# Patient Record
Sex: Female | Born: 1990 | Race: Black or African American | Hispanic: No | Marital: Single | Smoking: Never smoker
Health system: Southern US, Community
[De-identification: ages and names within clinical notes are randomized; demographics above are authoritative.]

---

## 2012-05-14 ENCOUNTER — Encounter (HOSPITAL_COMMUNITY): Payer: Self-pay | Admitting: *Deleted

## 2012-05-14 ENCOUNTER — Emergency Department (HOSPITAL_COMMUNITY): Payer: No Typology Code available for payment source

## 2012-05-14 ENCOUNTER — Emergency Department (HOSPITAL_COMMUNITY)
Admission: EM | Admit: 2012-05-14 | Discharge: 2012-05-14 | Disposition: A | Discharge status: Home / Self Care | Payer: No Typology Code available for payment source | Attending: Emergency Medicine | Admitting: Emergency Medicine

## 2012-05-14 DIAGNOSIS — Y92410 Unspecified street and highway as the place of occurrence of the external cause: Secondary | ICD-10-CM | POA: Insufficient documentation

## 2012-05-14 DIAGNOSIS — Y9389 Activity, other specified: Secondary | ICD-10-CM | POA: Insufficient documentation

## 2012-05-14 DIAGNOSIS — S161XXA Strain of muscle, fascia and tendon at neck level, initial encounter: Secondary | ICD-10-CM

## 2012-05-14 DIAGNOSIS — S139XXA Sprain of joints and ligaments of unspecified parts of neck, initial encounter: Secondary | ICD-10-CM | POA: Insufficient documentation

## 2012-05-14 MED ORDER — CYCLOBENZAPRINE HCL 10 MG PO TABS: 10.0000 mg | ORAL_TABLET | Freq: Two times a day (BID) | ORAL | Status: AC | PRN

## 2012-05-14 MED ORDER — IBUPROFEN 800 MG PO TABS: 800.0000 mg | ORAL_TABLET | Freq: Three times a day (TID) | ORAL | Status: AC
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
## 2012-05-14 NOTE — ED Notes (Signed)
Upstill, PA at bedside for evaluation.

## 2012-05-14 NOTE — ED Notes (Signed)
Returned from xray

## 2012-05-14 NOTE — ED Notes (Signed)
Pt c/o soreness in neck. No distress.
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
## 2012-05-15 NOTE — ED Provider Notes (Signed)
Medical screening examination/treatment/procedure(s) were performed by non-physician practitioner and as supervising physician I was immediately available for consultation/collaboration.   David Yelverton, MD 05/15/12 0756 
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
## 2016-09-30 ENCOUNTER — Ambulatory Visit (HOSPITAL_COMMUNITY)
Admission: EM | Admit: 2016-09-30 | Discharge: 2016-09-30 | Disposition: A | Discharge status: Home / Self Care | Payer: Self-pay | Attending: Family Medicine | Admitting: Family Medicine

## 2016-09-30 ENCOUNTER — Encounter (HOSPITAL_COMMUNITY): Payer: Self-pay

## 2016-09-30 DIAGNOSIS — S161XXA Strain of muscle, fascia and tendon at neck level, initial encounter: Secondary | ICD-10-CM

## 2016-09-30 DIAGNOSIS — M549 Dorsalgia, unspecified: Secondary | ICD-10-CM

## 2016-09-30 DIAGNOSIS — M542 Cervicalgia: Secondary | ICD-10-CM

## 2016-09-30 MED ORDER — CYCLOBENZAPRINE HCL 10 MG PO TABS: 10.0000 mg | ORAL_TABLET | Freq: Two times a day (BID) | ORAL | 0 refills | Status: AC | PRN

## 2016-09-30 MED ORDER — NAPROXEN 500 MG PO TABS: 500.0000 mg | ORAL_TABLET | Freq: Two times a day (BID) | ORAL | 0 refills | Status: AC
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
## 2018-11-09 ENCOUNTER — Other Ambulatory Visit: Payer: Self-pay

## 2018-11-09 DIAGNOSIS — Z20822 Contact with and (suspected) exposure to covid-19: Secondary | ICD-10-CM

## 2018-11-11 LAB — NOVEL CORONAVIRUS, NAA: SARS-CoV-2, NAA: NOT DETECTED

## 2019-02-07 ENCOUNTER — Other Ambulatory Visit: Payer: Self-pay

## 2019-02-07 DIAGNOSIS — Z20822 Contact with and (suspected) exposure to covid-19: Secondary | ICD-10-CM

## 2019-02-10 LAB — NOVEL CORONAVIRUS, NAA: SARS-CoV-2, NAA: NOT DETECTED

## 2019-03-12 ENCOUNTER — Ambulatory Visit: Payer: BC Managed Care – PPO | Attending: Internal Medicine

## 2019-03-12 DIAGNOSIS — Z20822 Contact with and (suspected) exposure to covid-19: Secondary | ICD-10-CM | POA: Insufficient documentation

## 2019-03-15 LAB — NOVEL CORONAVIRUS, NAA: SARS-CoV-2, NAA: NOT DETECTED

## 2019-09-02 ENCOUNTER — Ambulatory Visit: Payer: BC Managed Care – PPO | Attending: Internal Medicine

## 2019-09-02 DIAGNOSIS — Z20822 Contact with and (suspected) exposure to covid-19: Secondary | ICD-10-CM

## 2019-09-03 LAB — NOVEL CORONAVIRUS, NAA: SARS-CoV-2, NAA: NOT DETECTED

## 2019-09-03 LAB — SARS-COV-2, NAA 2 DAY TAT

## 2019-10-30 ENCOUNTER — Other Ambulatory Visit: Payer: Self-pay | Admitting: Critical Care Medicine

## 2019-10-30 ENCOUNTER — Other Ambulatory Visit: Payer: BC Managed Care – PPO

## 2019-10-30 DIAGNOSIS — Z20822 Contact with and (suspected) exposure to covid-19: Secondary | ICD-10-CM

## 2019-11-01 LAB — SARS-COV-2, NAA 2 DAY TAT

## 2019-11-01 LAB — NOVEL CORONAVIRUS, NAA: SARS-CoV-2, NAA: NOT DETECTED

## 2020-04-21 ENCOUNTER — Ambulatory Visit: Payer: BC Managed Care – PPO | Attending: Internal Medicine

## 2020-04-21 DIAGNOSIS — Z23 Encounter for immunization: Secondary | ICD-10-CM

## 2020-04-21 NOTE — Progress Notes (Signed)
   Covid-19 Vaccination Clinic  Name:  Amber Bowers    MRN: [id number] DOB: August 12, 1990  04/21/2020  [name] was observed post Covid-19 immunization for 15 minutes without incident. She was provided with Vaccine Information Sheet and instruction to access the V-Safe system.   [name] was instructed to call 911 with any severe reactions post vaccine: Marland Kitchen Difficulty breathing  . Swelling of face and throat  . A fast heartbeat  . A bad rash all over body  . Dizziness and weakness   Immunizations Administered    Name Date Dose VIS Date Route   PFIZER Comrnaty(Gray TOP) Covid-19 Vaccine 04/21/2020 12:52 PM 0.3 mL 02/13/2020 Intramuscular   Manufacturer: [employer], [employer]   Lot: [id number]   NDC: [phone]

## 2020-05-12 ENCOUNTER — Other Ambulatory Visit: Payer: Self-pay

## 2020-05-12 ENCOUNTER — Ambulatory Visit: Payer: BC Managed Care – PPO | Attending: Internal Medicine

## 2020-05-12 DIAGNOSIS — Z23 Encounter for immunization: Secondary | ICD-10-CM

## 2020-05-12 NOTE — Progress Notes (Signed)
   Covid-19 Vaccination Clinic  Name:  Amber Bowers    MRN: [id number] DOB: Jul 28, 1990  05/12/2020  Ms. Amber Bowers was observed post Covid-19 immunization for 15 minutes without incident. She was provided with Vaccine Information Sheet and instruction to access the V-Safe system.   Ms. Amber Bowers was instructed to call 911 with any severe reactions post vaccine: Marland Kitchen Difficulty breathing  . Swelling of face and throat  . A fast heartbeat  . A bad rash all over body  . Dizziness and weakness   Immunizations Administered    Name Date Dose VIS Date Route   PFIZER Comrnaty(Gray TOP) Covid-19 Vaccine 05/12/2020  1:07 PM 0.3 mL 02/13/2020 Intramuscular   Manufacturer: [employer], [employer]   Lot: [id number]   NDC: [id number]

## 2022-03-26 ENCOUNTER — Other Ambulatory Visit: Payer: Self-pay

## 2022-03-26 ENCOUNTER — Ambulatory Visit (HOSPITAL_COMMUNITY)
Admission: EM | Admit: 2022-03-26 | Discharge: 2022-03-26 | Disposition: A | Discharge status: Home / Self Care | Payer: BC Managed Care – PPO | Attending: Emergency Medicine | Admitting: Emergency Medicine

## 2022-03-26 ENCOUNTER — Encounter (HOSPITAL_COMMUNITY): Payer: Self-pay | Admitting: *Deleted

## 2022-03-26 DIAGNOSIS — O210 Mild hyperemesis gravidarum: Secondary | ICD-10-CM

## 2022-03-26 DIAGNOSIS — R112 Nausea with vomiting, unspecified: Secondary | ICD-10-CM | POA: Diagnosis not present

## 2022-03-26 DIAGNOSIS — Z3201 Encounter for pregnancy test, result positive: Secondary | ICD-10-CM

## 2022-03-26 DIAGNOSIS — E860 Dehydration: Secondary | ICD-10-CM | POA: Diagnosis not present

## 2022-03-26 LAB — POCT URINALYSIS DIPSTICK, ED / UC
Glucose, UA: NEGATIVE mg/dL
Ketones, ur: 40 mg/dL — AB
Leukocytes,Ua: NEGATIVE
Nitrite: NEGATIVE
Protein, ur: >=300 mg/dL — AB
Specific Gravity, Urine: >=1.03 (ref 1.005–1.030)
Urobilinogen, UA: 1 mg/dL (ref 0.0–1.0)
pH: 6 (ref 5.0–8.0)

## 2022-03-26 LAB — POC URINE PREG, ED: Preg Test, Ur: POSITIVE — AB

## 2022-03-26 MED ORDER — VITAMIN B-6 25 MG PO TABS: 25.0000 mg | ORAL_TABLET | Freq: Three times a day (TID) | ORAL | 0 refills | Status: AC | PRN

## 2022-03-26 NOTE — ED Triage Notes (Signed)
Pt reports N/V since Tuesday with possible pregnancy

## 2022-03-26 NOTE — Discharge Instructions (Addendum)
Your pregnancy test is positive. Please push fluids, avoid spicy/greasy fried foods. Take pyridoxine as directed for nausea. Daily prenatal vitamins. Follow up with OB for prenatal care. Return as needed. If nausea,vomiting worsens,unable to keep anything down or you develop abdominal pain will need to go to ER for IV fluids,further management.
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
## 2022-04-19 DIAGNOSIS — R112 Nausea with vomiting, unspecified: Secondary | ICD-10-CM
